# Patient Record
Sex: Male | Born: 2000 | Race: Black or African American | Hispanic: No | Marital: Single
Health system: Southern US, Community
[De-identification: ages and names within clinical notes are randomized; demographics above are authoritative.]

## PROBLEM LIST (undated history)

## (undated) DIAGNOSIS — R51 Headache: Secondary | ICD-10-CM

## (undated) HISTORY — DX: Headache: R51

## (undated) HISTORY — PX: CIRCUMCISION: SHX1350

---
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
## 2022-03-05 ENCOUNTER — Telehealth: Payer: Self-pay | Admitting: Neurology

## 2022-03-05 ENCOUNTER — Ambulatory Visit (INDEPENDENT_AMBULATORY_CARE_PROVIDER_SITE_OTHER): Payer: Self-pay | Admitting: Neurology

## 2022-03-05 ENCOUNTER — Encounter: Payer: Self-pay | Admitting: Neurology

## 2022-03-05 DIAGNOSIS — Z91199 Patient's noncompliance with other medical treatment and regimen due to unspecified reason: Secondary | ICD-10-CM

## 2022-03-05 NOTE — Progress Notes (Signed)
William Gamble no showed new patient appointment today.    This is her first no-show at least here.  If patient calls back please have a talk, we have a lot of patients awaiting appointments and its really not fair to them when people just do not show up, please ask not to make an appointment unless intends on coming.  Something could have happened today we totally understand that we are happy to see back but if she no-shows again she will be dismissed from our practice. Below Is precharting in case of another appointment     Provider:  Dr William Gamble Requesting Provider: Dianne Dun, FNP Primary Care Provider:  Harrie Jeans, MD  CC:    HPI:  William Gamble is a 22 y.o. male here as requested by William Dun, FNP for headaches. PMHx migraine without aura, tension headache, difficulty sleeping and anxiety.  I reviewed William Gamble's notes, patient has had headaches for years, asked to evaluate and treat, getting headaches 3-4 times a week taking 400 mg of ibuprofen, headache pain is 6-7 in the forehead and across both eyes, she works at an [employer] center, vital signs appeared normal, she is obese at BMI 38.9, exam findings were unremarkable overall normal, she has had headaches since middle school, has been seen by neurology in the past and given medicine but quit taking it, diagnostic testing included vision screen left 20/15 right 2015, CMP, T4, CBC, TSH, hemoglobin A1c and lipid panel at this well visit from [facility] pediatrics.  I reviewed her epic chart, it appears she saw pediatric neurology in 2014.  At that time described the headache as frontal with intensity 7-8 out of 10 throbbing with photosensitivity no visual symptoms or triggers.  No history of head trauma or concussion, per Dr. Karsten Gamble and no family history of headaches.  Neurologic exam is completely normal.  Diagnosed with migraines likely.  Normal neurologic exam.  They discussed diet and lifestyle and a headache diary and Motrin  Tylenol and started Topamax as well as magnesium.  Imaging was not ordered at that time.  He was seen in [place] [facility] in 2017 for his headaches which is where he was prescribed the amitriptyline.  Per patient and his mother, headache started in grade school, has tried several medications with no improvement, having frequent headaches taking over-the-counter medications frequently, at least 2-3 times a week sometimes almost every other day or every day, multiple anxiety and stress issues over the past years including death of his father in a car accident and also death of his grandparent.  He has described his headaches as frontal and retro-orbital headaches, can be unilateral or bilateral, throbbing and pressure-like with moderate to severe intensity 7-9 out of 10 on a pain that may last 4 to 24 hours usually in the afternoon after school which may be a trigger, accompanied by photophobia and nausea, no visual symptoms, poor sleep hygiene, no head trauma concussions but doing well academically.  Examination in 2017 by Dr. Brayton Gamble a day was normal (per review of his notes) who also started him on nortriptyline and discussed lifestyle and headache calendar and psychologist to work on relaxation techniques for anxiety issues.  Reviewed notes, labs and imaging from outside physicians, which showed:  From a thorough review of records, medications tried that can be used in migraine management include: Topiramate.  Amitriptyline.  Blood pressure medications contraindicated due to hypotension.  Magnesium, B2, over-the-counter medication such as ibuprofen and Tylenol  and Motrin.

## 2022-03-05 NOTE — Telephone Encounter (Signed)
William Gamble no showed new patient appointment today.    This is his first no-show at least here.  If patient calls back please have a talk, we have a lot of patients awaiting appointments and its really not fair to them when people just do not show up, please ask not to make an appointment unless intends on coming.  However Something legitimate could have happened today and we totally understand that we are happy to see back but if he no-shows again he will be dismissed from our practice.
